# Patient Record
Sex: Female | Born: 2000 | Race: Black or African American | Hispanic: No | Marital: Single | Smoking: Never smoker
Health system: Southern US, Community
[De-identification: ages and names within clinical notes are randomized; demographics above are authoritative.]

## PROBLEM LIST (undated history)

## (undated) DIAGNOSIS — L309 Dermatitis, unspecified: Secondary | ICD-10-CM

## (undated) DIAGNOSIS — F988 Other specified behavioral and emotional disorders with onset usually occurring in childhood and adolescence: Secondary | ICD-10-CM

## (undated) DIAGNOSIS — J45909 Unspecified asthma, uncomplicated: Secondary | ICD-10-CM

## (undated) DIAGNOSIS — F909 Attention-deficit hyperactivity disorder, unspecified type: Secondary | ICD-10-CM

## (undated) DIAGNOSIS — J302 Other seasonal allergic rhinitis: Secondary | ICD-10-CM

## (undated) DIAGNOSIS — J309 Allergic rhinitis, unspecified: Secondary | ICD-10-CM

## (undated) HISTORY — DX: Unspecified asthma, uncomplicated: J45.909

## (undated) HISTORY — DX: Dermatitis, unspecified: L30.9

## (undated) HISTORY — DX: Allergic rhinitis, unspecified: J30.9

## (undated) HISTORY — DX: Other specified behavioral and emotional disorders with onset usually occurring in childhood and adolescence: F98.8

---
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
## 2020-03-08 ENCOUNTER — Emergency Department (HOSPITAL_COMMUNITY)
Admission: EM | Admit: 2020-03-08 | Discharge: 2020-03-08 | Disposition: A | Discharge status: Home / Self Care | Payer: Medicaid Other | Attending: Emergency Medicine | Admitting: Emergency Medicine

## 2020-03-08 ENCOUNTER — Other Ambulatory Visit: Payer: Self-pay

## 2020-03-08 ENCOUNTER — Encounter (HOSPITAL_COMMUNITY): Payer: Self-pay

## 2020-03-08 DIAGNOSIS — J45909 Unspecified asthma, uncomplicated: Secondary | ICD-10-CM | POA: Insufficient documentation

## 2020-03-08 DIAGNOSIS — J069 Acute upper respiratory infection, unspecified: Secondary | ICD-10-CM | POA: Insufficient documentation

## 2020-03-08 DIAGNOSIS — Z20822 Contact with and (suspected) exposure to covid-19: Secondary | ICD-10-CM | POA: Diagnosis not present

## 2020-03-08 DIAGNOSIS — R059 Cough, unspecified: Secondary | ICD-10-CM | POA: Diagnosis present

## 2020-03-08 LAB — RESP PANEL BY RT-PCR (FLU A&B, COVID) ARPGX2
Influenza A by PCR: NEGATIVE
Influenza B by PCR: NEGATIVE
SARS Coronavirus 2 by RT PCR: NEGATIVE
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
## 2020-03-08 NOTE — Discharge Instructions (Addendum)
Recommend following up with your primary care doctor regarding your symptoms from today.  If you develop any difficulty in breathing, chest pain, high fevers or other new concerning symptom, return to the emergency room for reassessment.  Otherwise recommend taking Tylenol or Motrin as needed for your pain control.

## 2020-03-08 NOTE — ED Triage Notes (Signed)
Pt presents with c/o cough. Pt reports she has had the cough for 2 days.

## 2020-06-15 ENCOUNTER — Encounter (HOSPITAL_COMMUNITY): Payer: Self-pay

## 2020-06-15 ENCOUNTER — Emergency Department (HOSPITAL_COMMUNITY)
Admission: EM | Admit: 2020-06-15 | Discharge: 2020-06-15 | Disposition: A | Discharge status: Home / Self Care | Payer: Medicaid Other | Attending: Emergency Medicine | Admitting: Emergency Medicine

## 2020-06-15 ENCOUNTER — Other Ambulatory Visit: Payer: Self-pay

## 2020-06-15 DIAGNOSIS — Z7951 Long term (current) use of inhaled steroids: Secondary | ICD-10-CM | POA: Diagnosis not present

## 2020-06-15 DIAGNOSIS — T40711A Poisoning by cannabis, accidental (unintentional), initial encounter: Secondary | ICD-10-CM | POA: Diagnosis present

## 2020-06-15 DIAGNOSIS — J45909 Unspecified asthma, uncomplicated: Secondary | ICD-10-CM | POA: Insufficient documentation

## 2020-06-15 DIAGNOSIS — T6591XA Toxic effect of unspecified substance, accidental (unintentional), initial encounter: Secondary | ICD-10-CM

## 2020-06-15 LAB — COMPREHENSIVE METABOLIC PANEL
ALT: 17 U/L (ref 0–44)
AST: 16 U/L (ref 15–41)
Albumin: 4.3 g/dL (ref 3.5–5.0)
Alkaline Phosphatase: 47 U/L (ref 38–126)
Anion gap: 8 (ref 5–15)
BUN: 12 mg/dL (ref 6–20)
CO2: 21 mmol/L — ABNORMAL LOW (ref 22–32)
Calcium: 9 mg/dL (ref 8.9–10.3)
Chloride: 109 mmol/L (ref 98–111)
Creatinine, Ser: 0.71 mg/dL (ref 0.44–1.00)
GFR, Estimated: >60 mL/min (ref >60)
Glucose, Bld: 100 mg/dL — ABNORMAL HIGH (ref 70–99)
Potassium: 3.6 mmol/L (ref 3.5–5.1)
Sodium: 138 mmol/L (ref 135–145)
Total Bilirubin: 0.6 mg/dL (ref 0.3–1.2)
Total Protein: 7.1 g/dL (ref 6.5–8.1)

## 2020-06-15 LAB — CBC WITH DIFFERENTIAL/PLATELET
Abs Immature Granulocytes: 0.02 10*3/uL (ref 0.00–0.07)
Basophils Absolute: 0 10*3/uL (ref 0.0–0.1)
Basophils Relative: 0 %
Eosinophils Absolute: 0 10*3/uL (ref 0.0–0.5)
Eosinophils Relative: 0 %
HCT: 40.9 % (ref 36.0–46.0)
Hemoglobin: 13.4 g/dL (ref 12.0–15.0)
Immature Granulocytes: 0 %
Lymphocytes Relative: 16 %
Lymphs Abs: 1.2 10*3/uL (ref 0.7–4.0)
MCH: 29.3 pg (ref 26.0–34.0)
MCHC: 32.8 g/dL (ref 30.0–36.0)
MCV: 89.5 fL (ref 80.0–100.0)
Monocytes Absolute: 0.4 10*3/uL (ref 0.1–1.0)
Monocytes Relative: 5 %
Neutro Abs: 6 10*3/uL (ref 1.7–7.7)
Neutrophils Relative %: 79 %
Platelets: 230 10*3/uL (ref 150–400)
RBC: 4.57 MIL/uL (ref 3.87–5.11)
RDW: 13.4 % (ref 11.5–15.5)
WBC: 7.6 10*3/uL (ref 4.0–10.5)
nRBC: 0 % (ref 0.0–0.2)

## 2020-06-15 LAB — ACETAMINOPHEN LEVEL: Acetaminophen (Tylenol), Serum: <10 ug/mL — ABNORMAL LOW (ref 10–30)

## 2020-06-15 LAB — RAPID URINE DRUG SCREEN, HOSP PERFORMED
Amphetamines: NOT DETECTED
Barbiturates: NOT DETECTED
Benzodiazepines: NOT DETECTED
Cocaine: NOT DETECTED
Opiates: NOT DETECTED
Tetrahydrocannabinol: POSITIVE — AB

## 2020-06-15 LAB — CBG MONITORING, ED: Glucose-Capillary: 89 mg/dL (ref 70–99)

## 2020-06-15 LAB — I-STAT BETA HCG BLOOD, ED (MC, WL, AP ONLY): I-stat hCG, quantitative: <5 m[IU]/mL (ref <5)

## 2020-06-15 LAB — SALICYLATE LEVEL: Salicylate Lvl: <7 mg/dL — ABNORMAL LOW (ref 7.0–30.0)

## 2020-06-15 LAB — ETHANOL: Alcohol, Ethyl (B): <10 mg/dL (ref <10)

## 2020-06-15 NOTE — ED Notes (Signed)
Patients mom Rinaldo Cloud is waiting outside in her car [phone]

## 2020-06-15 NOTE — ED Notes (Signed)
0 0

## 2020-06-15 NOTE — ED Triage Notes (Signed)
Ems reports delta 8 gummy @ 3pm. Patients states PTA felt like body was vibrating. EMS reports ST @160 , after NS ST@108 
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
## 2020-06-15 NOTE — ED Triage Notes (Signed)
Emergency Medicine Provider Triage Evaluation Note  Caitlin Stephenson , a 20 y.o. female  was evaluated in triage.  Pt complains of ingestion of cannabis gummy.  Review of Systems  Positive: conjunctival erythema Negative: Abdominal pain, chest pain  Physical Exam  BP 108/70 (BP Location: Right Arm)   Pulse (!) 103   Temp 98.5 F (36.9 C) (Oral)   Resp 18   Ht 5\' 4"  (1.626 m)   Wt 59 kg   LMP 06/05/2020   SpO2 100%   BMI 22.33 kg/m  Gen:   Awake, no distress  HEENT:  Atraumatic  Resp:  Normal effort  Cardiac:  Normal rate Abd:   Nondistended, nontender MSK:   Moves extremities without difficulty  Neuro:  Speech slightly sluured  Medical Decision Making  Medically screening exam initiated at 6:12 PM.  Appropriate orders placed.  Caitlin Stephenson was informed that the remainder of the evaluation will be completed by another provider, this initial triage assessment does not replace that evaluation, and the importance of remaining in the ED until their evaluation is complete.  Clinical Impression  Drug ingestion   Stann Ore, NP 06/15/20 2215
